# Patient Record
Sex: Female | Born: 1945 | Race: White | Hispanic: No | Marital: Married | Smoking: Never smoker
Health system: Southern US, Community
[De-identification: ages and names within clinical notes are randomized; demographics above are authoritative.]

## PROBLEM LIST (undated history)

## (undated) DIAGNOSIS — C569 Malignant neoplasm of unspecified ovary: Secondary | ICD-10-CM

## (undated) DIAGNOSIS — R7303 Prediabetes: Secondary | ICD-10-CM

## (undated) DIAGNOSIS — R42 Dizziness and giddiness: Secondary | ICD-10-CM

## (undated) DIAGNOSIS — M75100 Unspecified rotator cuff tear or rupture of unspecified shoulder, not specified as traumatic: Secondary | ICD-10-CM

## (undated) DIAGNOSIS — R112 Nausea with vomiting, unspecified: Secondary | ICD-10-CM

## (undated) DIAGNOSIS — G5600 Carpal tunnel syndrome, unspecified upper limb: Secondary | ICD-10-CM

## (undated) DIAGNOSIS — K219 Gastro-esophageal reflux disease without esophagitis: Secondary | ICD-10-CM

## (undated) DIAGNOSIS — Z9889 Other specified postprocedural states: Secondary | ICD-10-CM

## (undated) DIAGNOSIS — E119 Type 2 diabetes mellitus without complications: Secondary | ICD-10-CM

## (undated) DIAGNOSIS — C50919 Malignant neoplasm of unspecified site of unspecified female breast: Secondary | ICD-10-CM

## (undated) DIAGNOSIS — H25019 Cortical age-related cataract, unspecified eye: Secondary | ICD-10-CM

## (undated) DIAGNOSIS — M1990 Unspecified osteoarthritis, unspecified site: Secondary | ICD-10-CM

## (undated) DIAGNOSIS — I10 Essential (primary) hypertension: Secondary | ICD-10-CM

## (undated) DIAGNOSIS — E785 Hyperlipidemia, unspecified: Secondary | ICD-10-CM

## (undated) HISTORY — PX: MASTECTOMY: SHX3

## (undated) HISTORY — PX: TUBAL LIGATION: SHX77

## (undated) HISTORY — PX: ABDOMINAL HYSTERECTOMY: SHX81

## (undated) HISTORY — PX: APPENDECTOMY: SHX54

## (undated) HISTORY — PX: EYE SURGERY: SHX253

## (undated) HISTORY — PX: CHOLECYSTECTOMY: SHX55

## (undated) HISTORY — PX: CATARACT EXTRACTION W/ INTRAOCULAR LENS IMPLANT: SHX1309

## (undated) HISTORY — PX: BREAST SURGERY: SHX581

---

## 2012-05-01 ENCOUNTER — Ambulatory Visit: Payer: Self-pay | Admitting: Unknown Physician Specialty

## 2012-05-05 LAB — PATHOLOGY REPORT

## 2012-05-17 ENCOUNTER — Ambulatory Visit: Payer: Self-pay

## 2014-01-13 ENCOUNTER — Ambulatory Visit: Payer: Self-pay | Admitting: Neurology

## 2014-05-15 ENCOUNTER — Ambulatory Visit: Payer: Self-pay | Admitting: Physician Assistant

## 2018-03-20 ENCOUNTER — Other Ambulatory Visit: Payer: Self-pay | Admitting: Neurology

## 2018-03-20 ENCOUNTER — Other Ambulatory Visit: Payer: Self-pay | Admitting: Orthopedic Surgery

## 2018-03-20 DIAGNOSIS — R200 Anesthesia of skin: Secondary | ICD-10-CM

## 2018-03-20 DIAGNOSIS — R202 Paresthesia of skin: Principal | ICD-10-CM

## 2018-03-25 ENCOUNTER — Encounter (INDEPENDENT_AMBULATORY_CARE_PROVIDER_SITE_OTHER): Payer: Self-pay

## 2018-03-25 ENCOUNTER — Ambulatory Visit
Admission: RE | Admit: 2018-03-25 | Discharge: 2018-03-25 | Disposition: A | Discharge status: Home / Self Care | Payer: Medicare HMO | Source: Ambulatory Visit | Attending: Neurology | Admitting: Neurology

## 2018-03-25 DIAGNOSIS — M4802 Spinal stenosis, cervical region: Secondary | ICD-10-CM | POA: Diagnosis not present

## 2018-03-25 DIAGNOSIS — R202 Paresthesia of skin: Secondary | ICD-10-CM | POA: Insufficient documentation

## 2018-03-25 DIAGNOSIS — R200 Anesthesia of skin: Secondary | ICD-10-CM | POA: Insufficient documentation

## 2019-02-03 ENCOUNTER — Other Ambulatory Visit: Payer: Self-pay | Admitting: *Deleted

## 2019-02-03 DIAGNOSIS — Z20822 Contact with and (suspected) exposure to covid-19: Secondary | ICD-10-CM

## 2019-02-04 LAB — NOVEL CORONAVIRUS, NAA: SARS-CoV-2, NAA: NOT DETECTED

## 2019-02-10 ENCOUNTER — Other Ambulatory Visit: Payer: Self-pay

## 2019-02-10 DIAGNOSIS — Z20822 Contact with and (suspected) exposure to covid-19: Secondary | ICD-10-CM

## 2019-02-12 LAB — NOVEL CORONAVIRUS, NAA: SARS-CoV-2, NAA: NOT DETECTED

## 2020-04-14 IMAGING — MR MR CERVICAL SPINE W/O CM
5 series · 33 of 48 positions shown · non-contrast
Comparison: None.

CLINICAL DATA: Right arm numbness and tingling.

EXAM:
MRI CERVICAL SPINE WITHOUT CONTRAST
TECHNIQUE: Multiplanar, multisequence MR imaging of the cervical spine was
performed. No intravenous contrast was administered.

[Series 2: T2 · sagittal · 3.0mm · 0.56mm/px · 6 of 13 slices shown (1 of 2)]
[im 1/13]
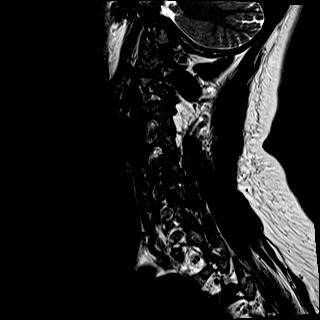
[im 3/13]
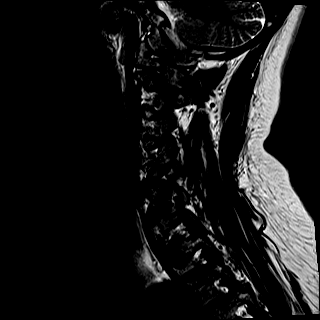
[im 5/13]
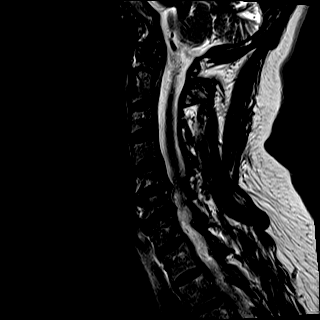
[im 8/13]
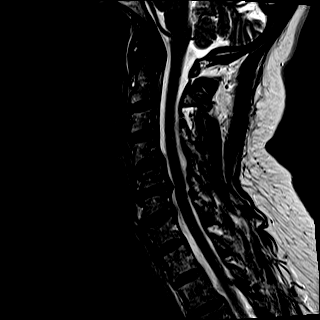
[im 10/13]
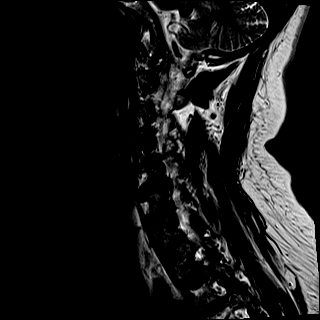
[im 13/13]
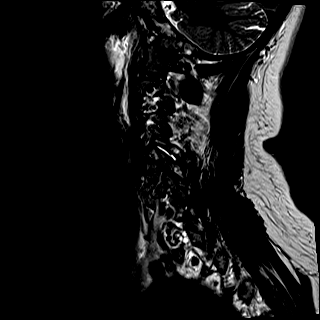

[Series 3: T1 · sagittal · 3.0mm · 0.70mm/px · 7 of 13 slices shown]
[im 1/13]
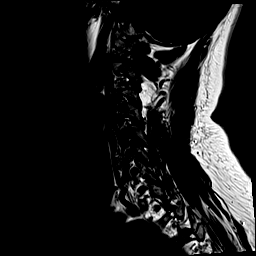
[im 3/13]
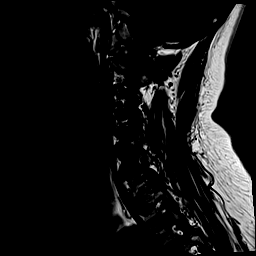
[im 5/13]
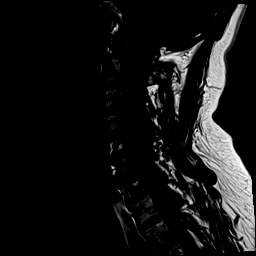
[im 7/13]
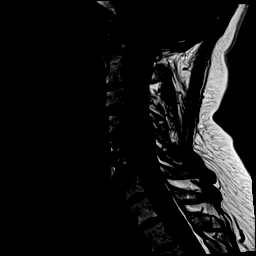
[im 9/13]
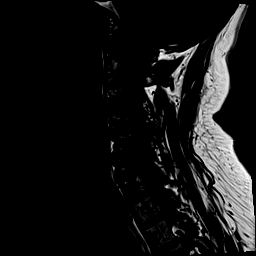
[im 11/13]
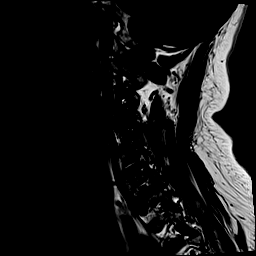
[im 13/13]
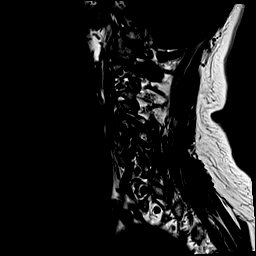

[Series 4: STIR · sagittal · 3.0mm · 0.35mm/px · 7 of 13 slices shown]
[im 1/13]
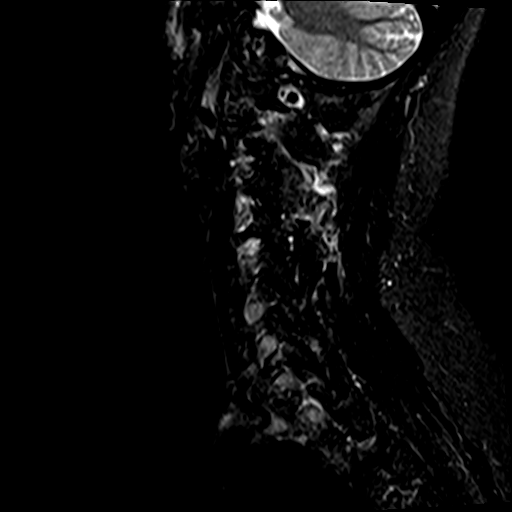
[im 3/13]
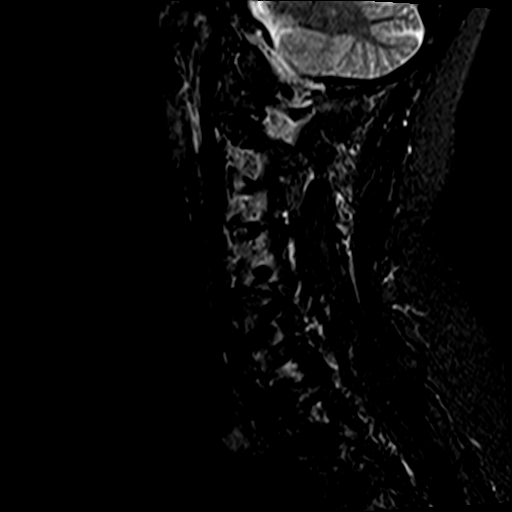
[im 5/13]
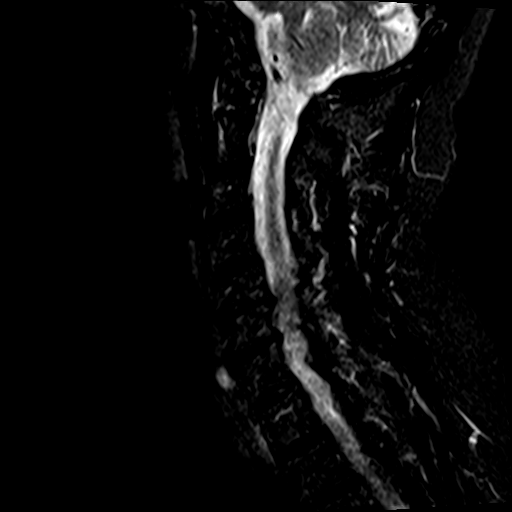
[im 7/13]
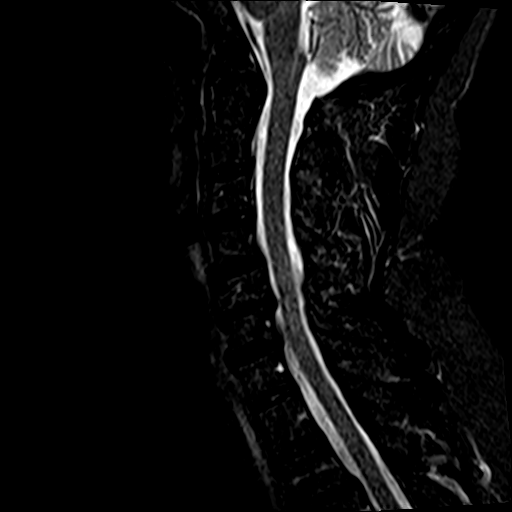
[im 9/13]
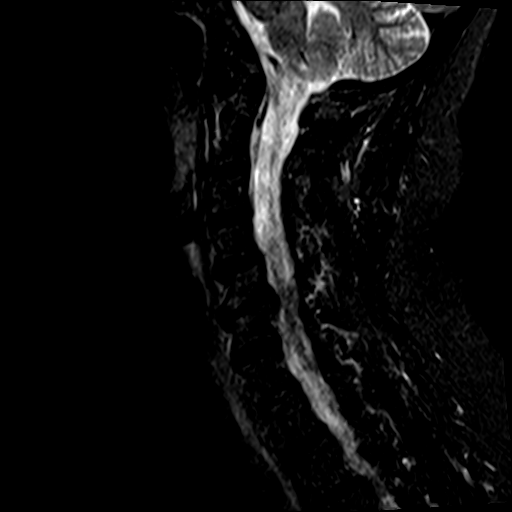
[im 11/13]
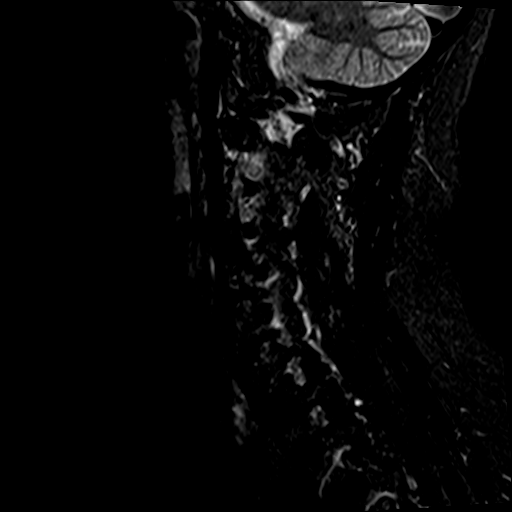
[im 13/13]
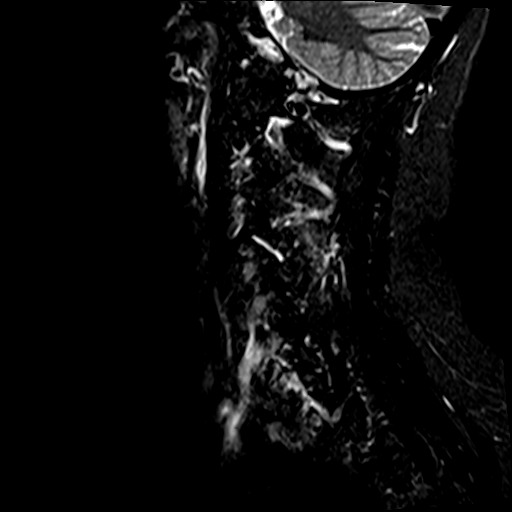

[Series 5: T2 · axial · 3.0mm · 0.62mm/px · z∈[-16,+81]mm · 8 of 27 slices shown (2 of 2)]
[im 1/27]
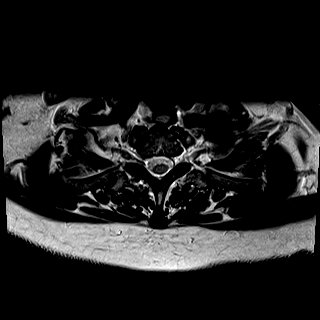
[im 5/27]
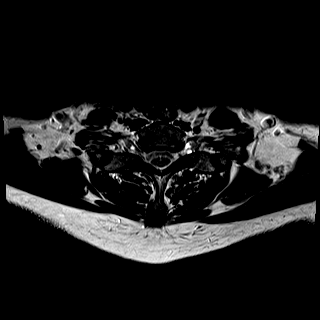
[im 9/27]
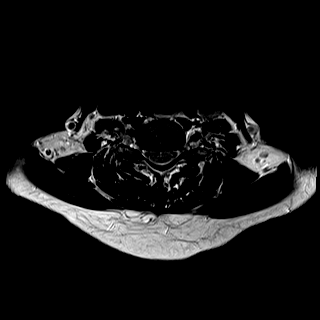
[im 13/27]
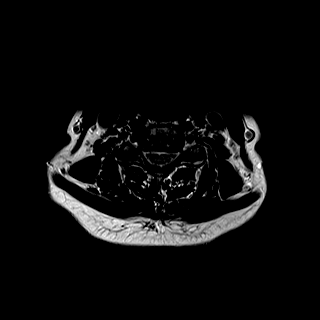
[im 15/27]
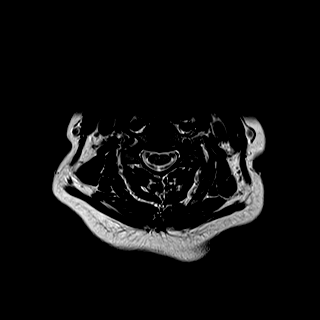
[im 19/27]
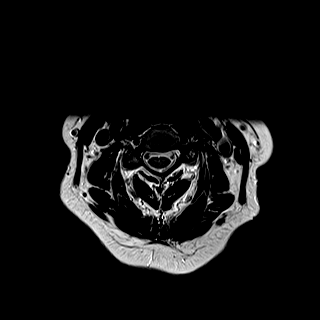
[im 23/27]
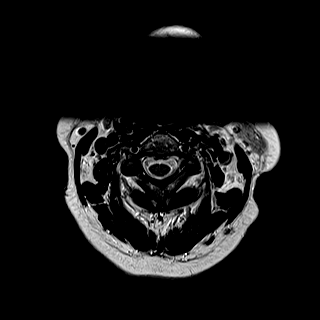
[im 27/27]
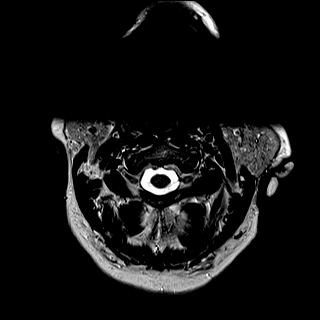

[Series 6: mpgr ax · axial · 3.0mm · 0.35mm/px · z∈[-8,+45]mm · 5 of 27 slices shown]
[im 1/27]
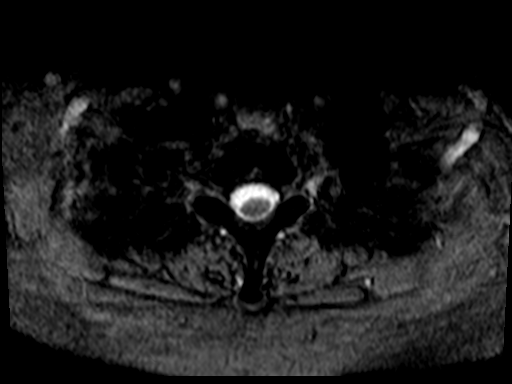
[im 5/27]
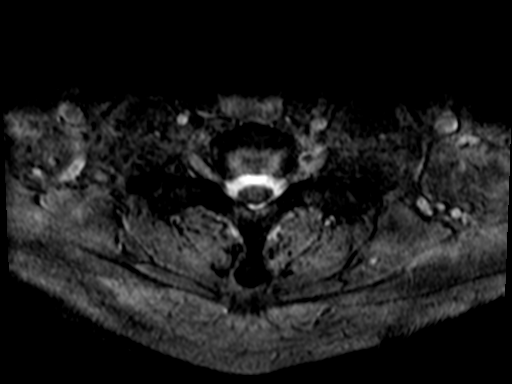
[im 9/27]
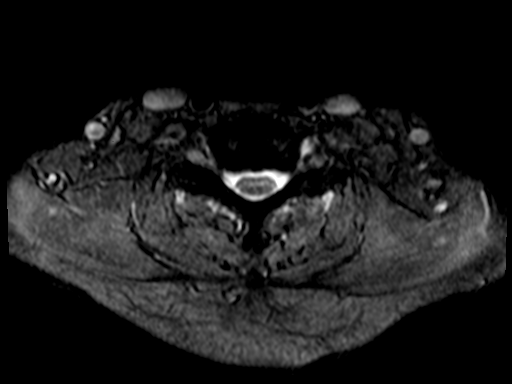
[im 13/27]
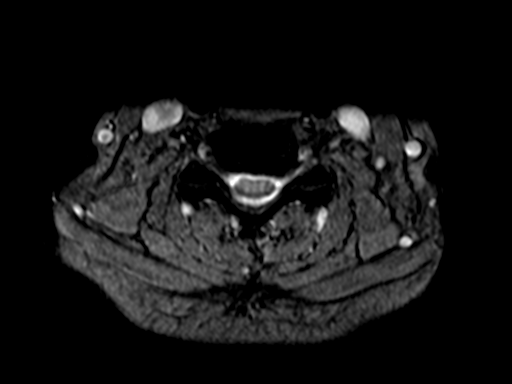
[im 15/27]
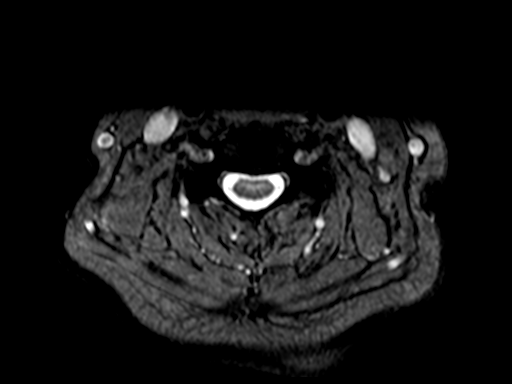

[33 of 48 positions shown; findings below may reference images not displayed]

FINDINGS: Alignment: 2 mm anterolisthesis C4-5. Mild retrolisthesis C5-6. Mild
anterolisthesis T2-3.

Vertebrae: Negative for fracture or mass.

Cord: Normal cord signal and morphology.  No cord lesion

Posterior Fossa, vertebral arteries, paraspinal tissues: Negative

Disc levels:

C2-3: Negative

C3-4: Negative

C4-5: Mild anterolisthesis. Shallow central disc protrusion and mild
facet degeneration on the left. No significant spinal or foraminal
stenosis.

C5-6: Mild retrolisthesis. Disc degeneration with diffuse uncinate
spurring. Mild spinal stenosis and mild to moderate foraminal
stenosis bilaterally due to spurring. Mild facet hypertrophy
bilaterally.

C6-7: Disc degeneration and spondylosis with diffuse uncinate
spurring. Mild spinal stenosis and mild foraminal stenosis
bilaterally

C7-T1: Negative
IMPRESSION: Disc and facet degeneration C4-5 without stenosis

Mild spinal stenosis C5-6 with mild to moderate foraminal stenosis
bilaterally due to spurring

Mild spinal stenosis and mild foraminal stenosis bilaterally C6-7
due to spurring.

## 2020-05-09 ENCOUNTER — Other Ambulatory Visit: Admission: RE | Admit: 2020-05-09 | Payer: Medicare HMO | Source: Ambulatory Visit

## 2020-06-30 ENCOUNTER — Encounter: Payer: Self-pay | Admitting: Ophthalmology

## 2020-06-30 ENCOUNTER — Other Ambulatory Visit: Payer: Self-pay

## 2020-07-04 ENCOUNTER — Other Ambulatory Visit: Payer: Self-pay

## 2020-07-04 ENCOUNTER — Other Ambulatory Visit
Admission: RE | Admit: 2020-07-04 | Discharge: 2020-07-04 | Disposition: A | Discharge status: Home / Self Care | Payer: Medicare HMO | Source: Ambulatory Visit | Attending: Ophthalmology | Admitting: Ophthalmology

## 2020-07-04 DIAGNOSIS — Z01812 Encounter for preprocedural laboratory examination: Secondary | ICD-10-CM | POA: Diagnosis present

## 2020-07-04 DIAGNOSIS — Z20822 Contact with and (suspected) exposure to covid-19: Secondary | ICD-10-CM | POA: Diagnosis not present

## 2020-07-04 LAB — SARS CORONAVIRUS 2 (TAT 6-24 HRS): SARS Coronavirus 2: NEGATIVE

## 2020-07-04 NOTE — Discharge Instructions (Signed)

## 2020-07-06 ENCOUNTER — Ambulatory Visit: Payer: Medicare HMO | Admitting: Anesthesiology

## 2020-07-06 ENCOUNTER — Other Ambulatory Visit: Payer: Self-pay

## 2020-07-06 ENCOUNTER — Encounter: Payer: Self-pay | Admitting: Ophthalmology

## 2020-07-06 ENCOUNTER — Encounter
Admission: RE | Disposition: A | Discharge status: Home / Self Care | Payer: Self-pay | Source: Home / Self Care | Attending: Ophthalmology

## 2020-07-06 ENCOUNTER — Ambulatory Visit
Admission: RE | Admit: 2020-07-06 | Discharge: 2020-07-06 | Disposition: A | Discharge status: Home / Self Care | Payer: Medicare HMO | Attending: Ophthalmology | Admitting: Ophthalmology

## 2020-07-06 DIAGNOSIS — Z87891 Personal history of nicotine dependence: Secondary | ICD-10-CM | POA: Diagnosis not present

## 2020-07-06 DIAGNOSIS — Z885 Allergy status to narcotic agent status: Secondary | ICD-10-CM | POA: Insufficient documentation

## 2020-07-06 DIAGNOSIS — Z7984 Long term (current) use of oral hypoglycemic drugs: Secondary | ICD-10-CM | POA: Diagnosis not present

## 2020-07-06 DIAGNOSIS — Z79899 Other long term (current) drug therapy: Secondary | ICD-10-CM | POA: Diagnosis not present

## 2020-07-06 DIAGNOSIS — Z7982 Long term (current) use of aspirin: Secondary | ICD-10-CM | POA: Insufficient documentation

## 2020-07-06 DIAGNOSIS — H2511 Age-related nuclear cataract, right eye: Secondary | ICD-10-CM | POA: Diagnosis present

## 2020-07-06 DIAGNOSIS — Z881 Allergy status to other antibiotic agents status: Secondary | ICD-10-CM | POA: Diagnosis not present

## 2020-07-06 DIAGNOSIS — Z882 Allergy status to sulfonamides status: Secondary | ICD-10-CM | POA: Diagnosis not present

## 2020-07-06 DIAGNOSIS — E1136 Type 2 diabetes mellitus with diabetic cataract: Secondary | ICD-10-CM | POA: Insufficient documentation

## 2020-07-06 HISTORY — DX: Carpal tunnel syndrome, unspecified upper limb: G56.00

## 2020-07-06 HISTORY — PX: CATARACT EXTRACTION W/PHACO: SHX586

## 2020-07-06 HISTORY — DX: Other specified postprocedural states: R11.2

## 2020-07-06 HISTORY — DX: Unspecified osteoarthritis, unspecified site: M19.90

## 2020-07-06 HISTORY — DX: Prediabetes: R73.03

## 2020-07-06 HISTORY — DX: Other specified postprocedural states: Z98.890

## 2020-07-06 HISTORY — DX: Dizziness and giddiness: R42

## 2020-07-06 HISTORY — DX: Essential (primary) hypertension: I10

## 2020-07-06 LAB — GLUCOSE, CAPILLARY
Glucose-Capillary: 124 mg/dL — ABNORMAL HIGH (ref 70–99)
Glucose-Capillary: 116 mg/dL — ABNORMAL HIGH (ref 70–99)

## 2020-07-06 SURGERY — PHACOEMULSIFICATION, CATARACT, WITH IOL INSERTION
Anesthesia: Monitor Anesthesia Care | Site: Eye | Laterality: Right

## 2020-07-06 MED ORDER — FENTANYL CITRATE (PF) 100 MCG/2ML IJ SOLN
INTRAMUSCULAR | Status: DC | PRN
  Administered 2020-07-06: 50 ug via INTRAVENOUS

## 2020-07-06 MED ORDER — ACETAMINOPHEN 325 MG PO TABS: 325.0000 mg | ORAL_TABLET | ORAL | Status: DC | PRN

## 2020-07-06 MED ORDER — CYCLOPENTOLATE HCL 2 % OP SOLN
1.0000 [drp] | OPHTHALMIC | Status: DC | PRN
  Administered 2020-07-06 (×3): 1 [drp] via OPHTHALMIC

## 2020-07-06 MED ORDER — NA HYALUR & NA CHOND-NA HYALUR 0.4-0.35 ML IO KIT
PACK | INTRAOCULAR | Status: DC | PRN
  Administered 2020-07-06: 1 mL via INTRAOCULAR

## 2020-07-06 MED ORDER — PHENYLEPHRINE HCL 10 % OP SOLN
1.0000 [drp] | OPHTHALMIC | Status: DC | PRN
  Administered 2020-07-06 (×3): 1 [drp] via OPHTHALMIC

## 2020-07-06 MED ORDER — ACETAMINOPHEN 160 MG/5ML PO SOLN: 325.0000 mg | ORAL | Status: DC | PRN

## 2020-07-06 MED ORDER — EPINEPHRINE PF 1 MG/ML IJ SOLN
INTRAOCULAR | Status: DC | PRN
  Administered 2020-07-06: 82 mL via OPHTHALMIC

## 2020-07-06 MED ORDER — TETRACAINE HCL 0.5 % OP SOLN
1.0000 [drp] | OPHTHALMIC | Status: DC | PRN
  Administered 2020-07-06 (×3): 1 [drp] via OPHTHALMIC

## 2020-07-06 MED ORDER — LIDOCAINE HCL (PF) 2 % IJ SOLN
INTRAOCULAR | Status: DC | PRN
  Administered 2020-07-06: 2 mL

## 2020-07-06 MED ORDER — MIDAZOLAM HCL 2 MG/2ML IJ SOLN
INTRAMUSCULAR | Status: DC | PRN
  Administered 2020-07-06: 2 mg via INTRAVENOUS

## 2020-07-06 MED ORDER — LACTATED RINGERS IV SOLN: INTRAVENOUS | Status: DC

## 2020-07-06 SURGICAL SUPPLY — 23 items
CANNULA ANT/CHMB 27G (MISCELLANEOUS) ×1 IMPLANT
CANNULA ANT/CHMB 27GA (MISCELLANEOUS) ×2 IMPLANT
GLOVE PROTEXIS LATEX SZ 7.5 (GLOVE) ×2 IMPLANT
GLOVE SURG LATEX 7.5 PF (GLOVE) IMPLANT
GLOVE SURG TRIUMPH 8.0 PF LTX (GLOVE) ×2 IMPLANT
GOWN STRL REUS W/ TWL LRG LVL3 (GOWN DISPOSABLE) ×2 IMPLANT
GOWN STRL REUS W/TWL LRG LVL3 (GOWN DISPOSABLE) ×4
LENS IOL DIOP 23.5 (Intraocular Lens) ×2 IMPLANT
LENS IOL TECNIS MONO 23.5 (Intraocular Lens) IMPLANT
MARKER SKIN DUAL TIP RULER LAB (MISCELLANEOUS) ×2 IMPLANT
NDL CAPSULORHEX 25GA (NEEDLE) ×1 IMPLANT
NDL FILTER BLUNT 18X1 1/2 (NEEDLE) ×2 IMPLANT
NEEDLE CAPSULORHEX 25GA (NEEDLE) ×2 IMPLANT
NEEDLE FILTER BLUNT 18X 1/2SAF (NEEDLE) ×2
NEEDLE FILTER BLUNT 18X1 1/2 (NEEDLE) ×2 IMPLANT
PACK CATARACT BRASINGTON (MISCELLANEOUS) ×2 IMPLANT
PACK EYE AFTER SURG (MISCELLANEOUS) ×2 IMPLANT
PACK OPTHALMIC (MISCELLANEOUS) ×2 IMPLANT
SOLUTION OPHTHALMIC SALT (MISCELLANEOUS) ×2 IMPLANT
SYR 3ML LL SCALE MARK (SYRINGE) ×4 IMPLANT
SYR TB 1ML LUER SLIP (SYRINGE) ×2 IMPLANT
WATER STERILE IRR 250ML POUR (IV SOLUTION) ×2 IMPLANT
WIPE NON LINTING 3.25X3.25 (MISCELLANEOUS) ×2 IMPLANT

## 2020-07-06 NOTE — Anesthesia Preprocedure Evaluation (Addendum)
Anesthesia Evaluation  Patient identified by MRN, date of birth, ID band Patient awake    Reviewed: Allergy & Precautions, H&P , NPO status , Patient's Chart, lab work & pertinent test results, reviewed documented beta blocker date and time   History of Anesthesia Complications (+) PONV and history of anesthetic complications  Airway Mallampati: II  TM Distance: >3 FB Neck ROM: full    Dental no notable dental hx.    Pulmonary neg pulmonary ROS,    Pulmonary exam normal breath sounds clear to auscultation       Cardiovascular Exercise Tolerance: Good hypertension, Normal cardiovascular exam Rhythm:regular Rate:Normal     Neuro/Psych negative neurological ROS  negative psych ROS   GI/Hepatic negative GI ROS, Neg liver ROS,   Endo/Other  diabetes, Type 2  Renal/GU negative Renal ROS  negative genitourinary   Musculoskeletal   Abdominal   Peds  Hematology negative hematology ROS (+)   Anesthesia Other Findings   Reproductive/Obstetrics negative OB ROS                            Anesthesia Physical Anesthesia Plan  ASA: II  Anesthesia Plan: MAC   Post-op Pain Management:    Induction:   PONV Risk Score and Plan:   Airway Management Planned:   Additional Equipment:   Intra-op Plan:   Post-operative Plan:   Informed Consent: I have reviewed the patients History and Physical, chart, labs and discussed the procedure including the risks, benefits and alternatives for the proposed anesthesia with the patient or authorized representative who has indicated his/her understanding and acceptance.     Dental Advisory Given  Plan Discussed with: CRNA and Anesthesiologist  Anesthesia Plan Comments:        Anesthesia Quick Evaluation

## 2020-07-06 NOTE — Anesthesia Postprocedure Evaluation (Signed)
Anesthesia Post Note  Patient: Diana Wilkinson  Procedure(s) Performed: CATARACT EXTRACTION PHACO AND INTRAOCULAR LENS PLACEMENT (IOC) RIGHT DIABETIC 6.64 01:18.1 8.5% (Right Eye)     Patient location during evaluation: PACU Anesthesia Type: MAC Level of consciousness: awake and alert Pain management: pain level controlled Vital Signs Assessment: post-procedure vital signs reviewed and stable Respiratory status: spontaneous breathing, nonlabored ventilation, respiratory function stable and patient connected to nasal cannula oxygen Cardiovascular status: stable and blood pressure returned to baseline Postop Assessment: no apparent nausea or vomiting Anesthetic complications: no   No complications documented.  Trecia Rogers

## 2020-07-06 NOTE — Anesthesia Procedure Notes (Signed)
Procedure Name: MAC Date/Time: 07/06/2020 10:46 AM Performed by: Silvana Newness, CRNA Pre-anesthesia Checklist: Patient identified, Emergency Drugs available, Suction available, Patient being monitored and Timeout performed Patient Re-evaluated:Patient Re-evaluated prior to induction Oxygen Delivery Method: Nasal cannula Placement Confirmation: positive ETCO2

## 2020-07-06 NOTE — H&P (Signed)

## 2020-07-06 NOTE — Transfer of Care (Signed)
Immediate Anesthesia Transfer of Care Note  Patient: Diana Wilkinson  Procedure(s) Performed: CATARACT EXTRACTION PHACO AND INTRAOCULAR LENS PLACEMENT (IOC) RIGHT DIABETIC 6.64 01:18.1 8.5% (Right Eye)  Patient Location: PACU  Anesthesia Type: MAC  Level of Consciousness: awake, alert  and patient cooperative  Airway and Oxygen Therapy: Patient Spontanous Breathing and Patient connected to supplemental oxygen  Post-op Assessment: Post-op Vital signs reviewed, Patient's Cardiovascular Status Stable, Respiratory Function Stable, Patent Airway and No signs of Nausea or vomiting  Post-op Vital Signs: Reviewed and stable  Complications: No complications documented.

## 2020-07-06 NOTE — Op Note (Signed)
LOCATION:  [facility]   PREOPERATIVE DIAGNOSIS:    Nuclear sclerotic cataract right eye. H25.11   POSTOPERATIVE DIAGNOSIS:  Nuclear sclerotic cataract right eye.     PROCEDURE:  Phacoemusification with posterior chamber intraocular lens placement of the right eye   ULTRASOUND TIME: Procedure(s): CATARACT EXTRACTION PHACO AND INTRAOCULAR LENS PLACEMENT (IOC) RIGHT DIABETIC 6.64 01:18.1 8.5% (Right)  LENS:   Implant Name Type Inv. Item Serial No. Manufacturer Lot No. LRB No. Used Action  LENS IOL DIOP 23.5 - [id number] Intraocular Lens LENS IOL DIOP 23.5 [id number]   Right 1 Implanted         SURGEON:  Wyonia Hough, MD   ANESTHESIA:  Topical with tetracaine drops and 2% Xylocaine jelly, augmented with 1% preservative-free intracameral lidocaine.    COMPLICATIONS:  None.   DESCRIPTION OF PROCEDURE:  The patient was identified in the holding room and transported to the operating room and placed in the supine position under the operating microscope.  The right eye was identified as the operative eye and it was prepped and draped in the usual sterile ophthalmic fashion.   A 1 millimeter clear-corneal paracentesis was made at the 12:00 position.  0.5 ml of preservative-free 1% lidocaine was injected into the anterior chamber. The anterior chamber was filled with Viscoat viscoelastic.  A 2.4 millimeter keratome was used to make a near-clear corneal incision at the 9:00 position.  A curvilinear capsulorrhexis was made with a cystotome and capsulorrhexis forceps.  Balanced salt solution was used to hydrodissect and hydrodelineate the nucleus.   Phacoemulsification was then used in stop and chop fashion to remove the lens nucleus and epinucleus.  The remaining cortex was then removed using the irrigation and aspiration handpiece. Provisc was then placed into the capsular bag to distend it for lens placement.  A lens was then injected into the capsular bag.  The  remaining viscoelastic was aspirated.   Wounds were hydrated with balanced salt solution.  The anterior chamber was inflated to a physiologic pressure with balanced salt solution.  No wound leaks were noted. Cefuroxime 0.1 ml of a 10mg /ml solution was injected into the anterior chamber for a dose of 1 mg of intracameral antibiotic at the completion of the case.   Timolol and Brimonidine drops were applied to the eye.  The patient was taken to the recovery room in stable condition without complications of anesthesia or surgery.   BRASINGTON,CHADWICK 07/06/2020, 11:05 AM

## 2020-07-07 ENCOUNTER — Encounter: Payer: Self-pay | Admitting: Ophthalmology

## 2020-11-03 ENCOUNTER — Encounter: Payer: Self-pay | Admitting: *Deleted

## 2020-11-04 ENCOUNTER — Ambulatory Visit: Payer: Medicare HMO | Admitting: Anesthesiology

## 2020-11-04 ENCOUNTER — Encounter
Admission: RE | Disposition: A | Discharge status: Home / Self Care | Payer: Self-pay | Source: Home / Self Care | Attending: Gastroenterology

## 2020-11-04 ENCOUNTER — Ambulatory Visit
Admission: RE | Admit: 2020-11-04 | Discharge: 2020-11-04 | Disposition: A | Discharge status: Home / Self Care | Payer: Medicare HMO | Attending: Gastroenterology | Admitting: Gastroenterology

## 2020-11-04 ENCOUNTER — Encounter: Payer: Self-pay | Admitting: *Deleted

## 2020-11-04 ENCOUNTER — Other Ambulatory Visit: Payer: Self-pay

## 2020-11-04 DIAGNOSIS — Z885 Allergy status to narcotic agent status: Secondary | ICD-10-CM | POA: Insufficient documentation

## 2020-11-04 DIAGNOSIS — Z888 Allergy status to other drugs, medicaments and biological substances status: Secondary | ICD-10-CM | POA: Insufficient documentation

## 2020-11-04 DIAGNOSIS — Z79899 Other long term (current) drug therapy: Secondary | ICD-10-CM | POA: Diagnosis not present

## 2020-11-04 DIAGNOSIS — Z882 Allergy status to sulfonamides status: Secondary | ICD-10-CM | POA: Diagnosis not present

## 2020-11-04 DIAGNOSIS — Z881 Allergy status to other antibiotic agents status: Secondary | ICD-10-CM | POA: Insufficient documentation

## 2020-11-04 DIAGNOSIS — Z1211 Encounter for screening for malignant neoplasm of colon: Secondary | ICD-10-CM | POA: Insufficient documentation

## 2020-11-04 DIAGNOSIS — D123 Benign neoplasm of transverse colon: Secondary | ICD-10-CM | POA: Insufficient documentation

## 2020-11-04 DIAGNOSIS — Z7984 Long term (current) use of oral hypoglycemic drugs: Secondary | ICD-10-CM | POA: Diagnosis not present

## 2020-11-04 DIAGNOSIS — Z7982 Long term (current) use of aspirin: Secondary | ICD-10-CM | POA: Diagnosis not present

## 2020-11-04 DIAGNOSIS — E119 Type 2 diabetes mellitus without complications: Secondary | ICD-10-CM | POA: Diagnosis not present

## 2020-11-04 DIAGNOSIS — K640 First degree hemorrhoids: Secondary | ICD-10-CM | POA: Diagnosis not present

## 2020-11-04 HISTORY — PX: COLONOSCOPY WITH PROPOFOL: SHX5780

## 2020-11-04 HISTORY — DX: Malignant neoplasm of unspecified ovary: C56.9

## 2020-11-04 HISTORY — DX: Type 2 diabetes mellitus without complications: E11.9

## 2020-11-04 HISTORY — DX: Cortical age-related cataract, unspecified eye: H25.019

## 2020-11-04 HISTORY — DX: Hyperlipidemia, unspecified: E78.5

## 2020-11-04 HISTORY — DX: Malignant neoplasm of unspecified site of unspecified female breast: C50.919

## 2020-11-04 HISTORY — DX: Gastro-esophageal reflux disease without esophagitis: K21.9

## 2020-11-04 HISTORY — DX: Unspecified rotator cuff tear or rupture of unspecified shoulder, not specified as traumatic: M75.100

## 2020-11-04 LAB — GLUCOSE, CAPILLARY: Glucose-Capillary: 115 mg/dL — ABNORMAL HIGH (ref 70–99)

## 2020-11-04 SURGERY — COLONOSCOPY WITH PROPOFOL
Anesthesia: Monitor Anesthesia Care

## 2020-11-04 MED ORDER — PROPOFOL 500 MG/50ML IV EMUL
INTRAVENOUS | Status: DC | PRN
  Administered 2020-11-04: 100 ug/kg/min via INTRAVENOUS

## 2020-11-04 MED ORDER — LIDOCAINE HCL (CARDIAC) PF 100 MG/5ML IV SOSY
PREFILLED_SYRINGE | INTRAVENOUS | Status: DC | PRN
  Administered 2020-11-04: 80 mg via INTRAVENOUS

## 2020-11-04 MED ORDER — SODIUM CHLORIDE 0.9 % IV SOLN
INTRAVENOUS | Status: DC
  Administered 2020-11-04: 1000 mL via INTRAVENOUS

## 2020-11-04 MED ORDER — PROPOFOL 10 MG/ML IV BOLUS
INTRAVENOUS | Status: DC | PRN
  Administered 2020-11-04: 80 mg via INTRAVENOUS

## 2020-11-04 NOTE — Anesthesia Preprocedure Evaluation (Signed)
Anesthesia Evaluation  Patient identified by MRN, date of birth, ID band Patient awake    Reviewed: Allergy & Precautions, NPO status , Patient's Chart, lab work & pertinent test results  History of Anesthesia Complications (+) PONVNegative for: history of anesthetic complications  Airway Mallampati: I  TM Distance: >3 FB Neck ROM: Full    Dental no notable dental hx. (+) Teeth Intact   Pulmonary neg pulmonary ROS,    Pulmonary exam normal breath sounds clear to auscultation       Cardiovascular Exercise Tolerance: Good METS: 3 - Mets hypertension (Controlled), Pt. on medications Normal cardiovascular exam Rhythm:Regular Rate:Normal     Neuro/Psych  Neuromuscular disease (CTS) negative psych ROS   GI/Hepatic negative GI ROS, Neg liver ROS,   Endo/Other  negative endocrine ROSdiabetes  Renal/GU negative Renal ROS  negative genitourinary   Musculoskeletal  (+) Arthritis ,   Abdominal   Peds  Hematology negative hematology ROS (+)   Anesthesia Other Findings   Reproductive/Obstetrics negative OB ROS                             Anesthesia Physical Anesthesia Plan  ASA: 2  Anesthesia Plan: MAC   Post-op Pain Management:    Induction: Intravenous  PONV Risk Score and Plan:   Airway Management Planned: Natural Airway and Nasal Cannula  Additional Equipment:   Intra-op Plan:   Post-operative Plan:   Informed Consent: I have reviewed the patients History and Physical, chart, labs and discussed the procedure including the risks, benefits and alternatives for the proposed anesthesia with the patient or authorized representative who has indicated his/her understanding and acceptance.     Dental Advisory Given  Plan Discussed with: Anesthesiologist, CRNA and Surgeon  Anesthesia Plan Comments: (Patient consented for risks of anesthesia including but not limited to:  - adverse  reactions to medications - damage to eyes, teeth, lips or other oral mucosa - nerve damage due to positioning  - sore throat or hoarseness - Damage to heart, brain, nerves, lungs, other parts of body or loss of life  Patient voiced understanding.)        Anesthesia Quick Evaluation

## 2020-11-04 NOTE — Op Note (Signed)
[facility] Gastroenterology Patient Name: Diana Wilkinson Procedure Date: 11/04/2020 12:58 PM MRN: [id number] Account #: [id number] Date of Birth: 1945-10-19 Admit Type: Outpatient Age: 75 Room: [facility] ENDO ROOM 3 Gender: Female Note Status: Finalized Procedure:             Colonoscopy Indications:           Surveillance: Personal history of colonic polyps                         (unknown histology) on last colonoscopy 5 years ago Providers:             Andrey Farmer MD, MD Medicines:             Monitored Anesthesia Care Complications:         No immediate complications. Estimated blood loss:                         Minimal. Procedure:             Pre-Anesthesia Assessment:                        - Prior to the procedure, a History and Physical was                         performed, and patient medications and allergies were                         reviewed. The patient is competent. The risks and                         benefits of the procedure and the sedation options and                         risks were discussed with the patient. All questions                         were answered and informed consent was obtained.                         Patient identification and proposed procedure were                         verified by the physician, the nurse, the anesthetist                         and the technician in the endoscopy suite. Mental                         Status Examination: alert and oriented. Airway                         Examination: normal oropharyngeal airway and neck                         mobility. Respiratory Examination: clear to                         auscultation. CV Examination: normal. Prophylactic  Antibiotics: The patient does not require prophylactic                         antibiotics. Prior Anticoagulants: The patient has                         taken no previous anticoagulant or antiplatelet                          agents. ASA Grade Assessment: II - A patient with mild                         systemic disease. After reviewing the risks and                         benefits, the patient was deemed in satisfactory                         condition to undergo the procedure. The anesthesia                         plan was to use monitored anesthesia care (MAC).                         Immediately prior to administration of medications,                         the patient was re-assessed for adequacy to receive                         sedatives. The heart rate, respiratory rate, oxygen                         saturations, blood pressure, adequacy of pulmonary                         ventilation, and response to care were monitored                         throughout the procedure. The physical status of the                         patient was re-assessed after the procedure.                        After obtaining informed consent, the colonoscope was                         passed under direct vision. Throughout the procedure,                         the patient's blood pressure, pulse, and oxygen                         saturations were monitored continuously. The                         Colonoscope was introduced through the anus and  advanced to the the cecum, identified by appendiceal                         orifice and ileocecal valve. The colonoscopy was                         somewhat difficult due to a tortuous colon. The                         patient tolerated the procedure well. The quality of                         the bowel preparation was good. Findings:      The perianal and digital rectal examinations were normal.      A 3 mm polyp was found in the hepatic flexure. The polyp was sessile.       The polyp was removed with a cold snare. Resection and retrieval were       complete. Estimated blood loss was minimal.      Internal hemorrhoids were found during  retroflexion. The hemorrhoids       were Grade I (internal hemorrhoids that do not prolapse).      The exam was otherwise without abnormality on direct and retroflexion       views. Impression:            - One 3 mm polyp at the hepatic flexure, removed with                         a cold snare. Resected and retrieved.                        - Internal hemorrhoids.                        - The examination was otherwise normal on direct and                         retroflexion views. Recommendation:        - Discharge patient to home.                        - Resume previous diet.                        - Continue present medications.                        - Await pathology results.                        - Repeat colonoscopy for surveillance based on                         pathology results.                        - Return to referring physician as previously                         scheduled. Procedure Code(s):     --- Professional ---  45385, Colonoscopy, flexible; with removal of                         tumor(s), polyp(s), or other lesion(s) by snare                         technique Diagnosis Code(s):     --- Professional ---                        Z86.010, Personal history of colonic polyps                        K63.5, Polyp of colon                        K64.0, First degree hemorrhoids CPT copyright 2019 [employer]. All rights reserved. The codes documented in this report are preliminary and upon coder review may  be revised to meet current compliance requirements. Andrey Farmer MD, MD 11/04/2020 1:37:50 PM Number of Addenda: 0 Note Initiated On: 11/04/2020 12:58 PM Scope Withdrawal Time: 0 hours 10 minutes 36 seconds  Total Procedure Duration: 0 hours 19 minutes 10 seconds  Estimated Blood Loss:  Estimated blood loss was minimal.      [facility]

## 2020-11-04 NOTE — H&P (Signed)
Outpatient short stay form Pre-procedure 11/04/2020 1:06 PM Diana Miyamoto MD, MPH  Primary Physician: Dr. Kym Groom  Reason for visit:  Personal history of polyps  History of present illness:   75 y/o lady with history of hypertension and DM II with history of polyp on colonoscopy done 5 years ago (unknown path) here for surveillance colonoscopy. History of hysterectomy. No family history of GI malignancies. No blood thinners. No new symptoms.    Current Facility-Administered Medications:    0.9 %  sodium chloride infusion, , Intravenous, Continuous, Rennae Ferraiolo, Hilton Cork, MD, Last Rate: 20 mL/hr at 11/04/20 1244, 1,000 mL at 11/04/20 1244  Medications Prior to Admission  Medication Sig Dispense Refill Last Dose   acyclovir (ZOVIRAX) 400 MG tablet Take 400 mg by mouth as needed.      amLODipine (NORVASC) 5 MG tablet Take 5 mg by mouth daily.   11/04/2020 at 0530   B COMPLEX-C PO Take by mouth daily.   11/03/2020   Calcium-Magnesium 500-250 MG TABS Take by mouth daily.   Past Month   Cinnamon Bark POWD by Does not apply route.      L-THEANINE PO Take by mouth at bedtime.   Past Week   losartan (COZAAR) 50 MG tablet Take 50 mg by mouth in the morning and at bedtime.   11/04/2020 at 0530   Lysine 500 MG CAPS Take by mouth.   11/03/2020   Magnesium Oxide 500 MG CAPS Take by mouth.   Past Week   metFORMIN (GLUCOPHAGE) 500 MG tablet Take 500 mg by mouth 2 (two) times daily with a meal.   11/03/2020   Multiple Vitamins-Minerals (OCULAR VITAMINS PO) Take by mouth daily.   11/03/2020   traZODone (DESYREL) 50 MG tablet Take 50 mg by mouth at bedtime as needed for sleep.   11/03/2020   TURMERIC CURCUMIN PO Take by mouth.   11/03/2020   vitamin C (ASCORBIC ACID) 500 MG tablet Take 500 mg by mouth daily.   11/03/2020   Vitamin D3 (VITAMIN D) 25 MCG tablet Take 2,000 Units by mouth daily.   11/03/2020   ASPIRIN 81 PO Take by mouth daily.      carboxymethylcellulose (REFRESH PLUS) 0.5 % SOLN 1 drop 3 (three)  times daily as needed.        Allergies  Allergen Reactions   Ace Inhibitors Cough   Codeine Nausea Only   Statins    Sulfa Antibiotics Nausea Only   Zithromax [Azithromycin] Hives     Past Medical History:  Diagnosis Date   Arthritis    hands   Breast cancer ([place])    Carpal tunnel syndrome    right   Cataract cortical, senile    Diabetes mellitus without complication (HCC)    GERD (gastroesophageal reflux disease)    HLD (hyperlipidemia)    Hypertension    Ovarian cancer (HCC)    PONV (postoperative nausea and vomiting)    after 1st mastectomy   Pre-diabetes    Rotator cuff syndrome    Vertigo    2 "minor" episodes in last 5 years    Review of systems:  Otherwise negative.    Physical Exam  Gen: Alert, oriented. Appears stated age.  HEENT: PERRLA. Lungs: No respiratory distress CV: RRR Abd: soft, benign, no masses Ext: No edema    Planned procedures: Proceed with colonoscopy. The patient understands the nature of the planned procedure, indications, risks, alternatives and potential complications including but not limited to bleeding, infection, perforation, damage to  internal organs and possible oversedation/side effects from anesthesia. The patient agrees and gives consent to proceed.  Please refer to procedure notes for findings, recommendations and patient disposition/instructions.     Diana Miyamoto MD, MPH Gastroenterology 11/04/2020  1:06 PM

## 2020-11-04 NOTE — Transfer of Care (Signed)
Immediate Anesthesia Transfer of Care Note  Patient: Diana Wilkinson  Procedure(s) Performed: COLONOSCOPY WITH PROPOFOL  Patient Location: PACU  Anesthesia Type:MAC  Level of Consciousness: drowsy  Airway & Oxygen Therapy: Patient Spontanous Breathing  Post-op Assessment: Report given to RN and Post -op Vital signs reviewed and stable  Post vital signs: Reviewed and stable  Last Vitals:  Vitals Value Taken Time  BP    Temp    Pulse 63 11/04/20 1338  Resp 18 11/04/20 1338  SpO2 100 % 11/04/20 1338    Last Pain:  Vitals:   11/04/20 1221  TempSrc: Temporal  PainSc: 0-No pain         Complications: No notable events documented.

## 2020-11-04 NOTE — Interval H&P Note (Signed)
History and Physical Interval Note:  11/04/2020 1:09 PM  [place]  has presented today for surgery, with the diagnosis of hx of polyps.  The various methods of treatment have been discussed with the patient and family. After consideration of risks, benefits and other options for treatment, the patient has consented to  Procedure(s): COLONOSCOPY WITH PROPOFOL (N/A) as a surgical intervention.  The patient's history has been reviewed, patient examined, no change in status, stable for surgery.  I have reviewed the patient's chart and labs.  Questions were answered to the patient's satisfaction.     Lesly Rubenstein  Ok to proceed with colonoscopy

## 2020-11-05 NOTE — Anesthesia Postprocedure Evaluation (Signed)
Anesthesia Post Note  Patient: Diana Wilkinson  Procedure(s) Performed: COLONOSCOPY WITH PROPOFOL  Patient location during evaluation: PACU Anesthesia Type: MAC Level of consciousness: awake and alert Pain management: pain level controlled Vital Signs Assessment: post-procedure vital signs reviewed and stable Respiratory status: spontaneous breathing, nonlabored ventilation, respiratory function stable and patient connected to nasal cannula oxygen Cardiovascular status: stable and blood pressure returned to baseline Postop Assessment: no apparent nausea or vomiting Anesthetic complications: no   No notable events documented.   Last Vitals:  Vitals:   11/04/20 1347 11/04/20 1352  BP: (!) 124/58 (!) 137/54  Pulse: 60 69  Resp: 18 14  Temp:    SpO2: 100% 100%    Last Pain:  Vitals:   11/04/20 1352  TempSrc:   PainSc: 0-No pain                 Tonny Bollman

## 2020-11-07 ENCOUNTER — Encounter: Payer: Self-pay | Admitting: Gastroenterology

## 2020-11-08 LAB — SURGICAL PATHOLOGY

## 2021-12-20 ENCOUNTER — Other Ambulatory Visit: Payer: Self-pay | Admitting: Neurology

## 2021-12-20 DIAGNOSIS — M542 Cervicalgia: Secondary | ICD-10-CM

## 2022-01-01 ENCOUNTER — Ambulatory Visit
Admission: RE | Admit: 2022-01-01 | Discharge: 2022-01-01 | Disposition: A | Discharge status: Home / Self Care | Payer: Medicare HMO | Source: Ambulatory Visit | Attending: Neurology | Admitting: Neurology

## 2022-01-01 DIAGNOSIS — M542 Cervicalgia: Secondary | ICD-10-CM | POA: Diagnosis present
